# Patient Record
Sex: Male | Born: 2000 | Hispanic: No | Marital: Single | Smoking: Current every day smoker
Health system: Southern US, Community
[De-identification: ages and names within clinical notes are randomized; demographics above are authoritative.]

## PROBLEM LIST (undated history)

## (undated) ENCOUNTER — Emergency Department (HOSPITAL_COMMUNITY): Admission: EM | Payer: Medicaid Other | Source: Home / Self Care

## (undated) DIAGNOSIS — J45909 Unspecified asthma, uncomplicated: Secondary | ICD-10-CM

---
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
## 2022-12-15 ENCOUNTER — Encounter (HOSPITAL_COMMUNITY): Payer: Self-pay | Admitting: Emergency Medicine

## 2022-12-15 ENCOUNTER — Ambulatory Visit (HOSPITAL_COMMUNITY)
Admission: EM | Admit: 2022-12-15 | Discharge: 2022-12-15 | Disposition: A | Discharge status: Home / Self Care | Payer: Medicaid Other | Attending: Internal Medicine | Admitting: Internal Medicine

## 2022-12-15 DIAGNOSIS — H00015 Hordeolum externum left lower eyelid: Secondary | ICD-10-CM | POA: Insufficient documentation

## 2022-12-15 MED ORDER — AMOXICILLIN-POT CLAVULANATE 875-125 MG PO TABS: 1.0000 | ORAL_TABLET | Freq: Two times a day (BID) | ORAL | 0 refills | Status: AC

## 2022-12-15 NOTE — Discharge Instructions (Addendum)
Your eye looks very infected.  Take Augmentin antibiotic twice daily for the next 7 days.  Avoid scratching the eye.  Apply warm compresses with a clean washcloth to the eye.   Do not touch your eye anymore.  This will make it much worse and will stop it from improving and healing appropriately.  If you develop any blurry vision, worsening eye pain, bleeding from the site, or any other new or worsening symptoms, I would like for you to return to urgent care for further evaluation.  I swabbed your eye for gonorrhea today just to make sure this is not the cause. I will call you if the result is positive in the next 2-3 days.

## 2022-12-15 NOTE — ED Triage Notes (Signed)
Pt c/o left eye swelling for over a week. Little painful. Tried OTC eye drops. Denies visual impairment.

## 2022-12-15 NOTE — ED Provider Notes (Signed)
[facility]    CSN: [id number] Arrival date & time: 12/15/22  1635      History   Chief Complaint Chief Complaint  Patient presents with   Facial Swelling    HPI Robert Nicholson is a 22 y.o. male.   Patient presents to urgent care for evaluation of lower left eyelid swelling and soreness for the last 1 week. No recent trauma or injuries to the left eye, face, or head. He does not wear contacts or glasses for vision correction and denies eye redness, eye drainage, fever/chills, vision changes, and headache. No ear pain, sore throat, dizziness, or neck pain reported. Denies recent viral URI symptoms. He is unsure of when the swelling to the left eye became worse and has never had a stye in the past. He states he noticed a "black" area of growth to the outer canthus of the left eye after scratching the eye yesterday. He cannot recall when the black growth to the outer canthus of the eye started or when symptoms worsened as he states he does not look in the mirror frequently. Denies pain to the posterior left eye. Denies history of hypertension and diabetes. No recent antibiotic or steroid use. He is sexually active, however denies penile symptoms and recent known exposure to STD. He has not attempted use of any over the counter medicines.      Past Medical History:  Diagnosis Date   Asthma     There are no problems to display for this patient.   History reviewed. No pertinent surgical history.     Home Medications    Prior to Admission medications   Medication Sig Start Date End Date Taking? Authorizing Provider  amoxicillin-clavulanate (AUGMENTIN) 875-125 MG tablet Take 1 tablet by mouth every 12 (twelve) hours. 12/15/22  Yes Talbot Grumbling, FNP  benzonatate (TESSALON) 100 MG capsule Take 1 capsule (100 mg total) by mouth every 8 (eight) hours. 03/11/20   Griffin Basil, NP  fexofenadine (ALLEGRA) 60 MG tablet Take 1 tablet (60 mg total) by mouth 2  (two) times daily. 03/11/20   Griffin Basil, NP  ibuprofen (ADVIL) 400 MG tablet Take 1 tablet (400 mg total) by mouth every 6 (six) hours as needed. 03/11/20   Haskins, Bebe Shaggy, NP  ondansetron (ZOFRAN ODT) 4 MG disintegrating tablet Take 1 tablet (4 mg total) by mouth every 6 (six) hours as needed for nausea or vomiting. 10/08/18   Kristen Cardinal, NP    Family History No family history on file.  Social History     Allergies   Patient has no known allergies.   Review of Systems Review of Systems Per HPI  Physical Exam Triage Vital Signs ED Triage Vitals  Enc Vitals Group     BP 12/15/22 1809 125/80     Pulse Rate 12/15/22 1809 69     Resp 12/15/22 1809 14     Temp 12/15/22 1809 (!) 97.3 F (36.3 C)     Temp Source 12/15/22 1809 Oral     SpO2 12/15/22 1809 97 %     Weight --      Height --      Head Circumference --      Peak Flow --      Pain Score 12/15/22 1807 2     Pain Loc --      Pain Edu? --      Excl. in [place]? --    No data found.  Updated  Vital Signs BP 125/80 (BP Location: Left Arm)   Pulse 69   Temp (!) 97.3 F (36.3 C) (Oral)   Resp 14   SpO2 97%   Visual Acuity Right Eye Distance:   Left Eye Distance:   Bilateral Distance:    Right Eye Near:   Left Eye Near:    Bilateral Near:     Physical Exam Vitals and nursing note reviewed.  Constitutional:      Appearance: He is not ill-appearing or toxic-appearing.  HENT:     Head: Normocephalic and atraumatic.     Right Ear: Hearing and external ear normal.     Left Ear: Hearing and external ear normal.     Nose: Nose normal.     Mouth/Throat:     Lips: Pink.  Eyes:     General: Lids are normal. Vision grossly intact. Gaze aligned appropriately. No visual field deficit.       Right eye: No foreign body, discharge or hordeolum.        Left eye: Hordeolum present.No foreign body or discharge.     Extraocular Movements: Extraocular movements intact.     Conjunctiva/sclera: Conjunctivae normal.      Comments: EOMs intact without pain or dizziness elicited. Erythema and swelling present to the lower eyelid with crusted black lesion to the outer canthus of the left eye. Upper eyelid is erythematous. Difficult to separate the upper eyelid from the lower eyelid at the outer canthus of the left eye due to crusting. See image below for further detail.   Pulmonary:     Effort: Pulmonary effort is normal.  Musculoskeletal:     Cervical back: Neck supple.  Skin:    General: Skin is warm and dry.     Capillary Refill: Capillary refill takes less than 2 seconds.     Findings: No rash.  Neurological:     General: No focal deficit present.     Mental Status: He is alert and oriented to person, place, and time. Mental status is at baseline.     Cranial Nerves: No dysarthria or facial asymmetry.  Psychiatric:        Mood and Affect: Mood normal.        Speech: Speech normal.        Behavior: Behavior normal.        Thought Content: Thought content normal.        Judgment: Judgment normal.           UC Treatments / Results  Labs (all labs ordered are listed, but only abnormal results are displayed) Labs Reviewed  CYTOLOGY, (ORAL, ANAL, URETHRAL) ANCILLARY ONLY    EKG   Radiology No results found.  Procedures Procedures (including critical care time)  Medications Ordered in UC Medications - No data to display  Initial Impression / Assessment and Plan / UC Course  I have reviewed the triage vital signs and the nursing notes.  Pertinent labs & imaging results that were available during my care of the patient were reviewed by me and considered in my medical decision making (see chart for details).   1. Hordeolum externum of lower eyelid Presentation consistent with hordeolum, though this stye appears very infected. There is concern for possible gonococcal infection, cytology swab of eye drainage from lesion to the outer canthus of the left eye pending. Will treat today with  Augmentin antibiotic to be taken as directed. Patient continuously scratching eye in clinic during interview, therefore patch placed so that he  must leave the eye alone so that it is able to heal. His mother drove him to urgent care and plans on driving him home. Advised patient to refrain from driving while wearing eye patch (gauze and tape) over the left eye due to reduced depth perception. Warm compresses recommended to reduce infection. Strict ER and urgent care return precautions discussed should symptoms worsen. Follow-up with ophthalmology recommended.  Discussed physical exam and available lab work findings in clinic with patient.  Counseled patient regarding appropriate use of medications and potential side effects for all medications recommended or prescribed today. Discussed red flag signs and symptoms of worsening condition,when to call the PCP office, return to urgent care, and when to seek higher level of care in the emergency department. Patient verbalizes understanding and agreement with plan. All questions answered. Patient discharged in stable condition.    Final Clinical Impressions(s) / UC Diagnoses   Final diagnoses:  Hordeolum externum of left lower eyelid     Discharge Instructions      Your eye looks very infected.  Take Augmentin antibiotic twice daily for the next 7 days.  Avoid scratching the eye.  Apply warm compresses with a clean washcloth to the eye.   Do not touch your eye anymore.  This will make it much worse and will stop it from improving and healing appropriately.  If you develop any blurry vision, worsening eye pain, bleeding from the site, or any other new or worsening symptoms, I would like for you to return to urgent care for further evaluation.  I swabbed your eye for gonorrhea today just to make sure this is not the cause. I will call you if the result is positive in the next 2-3 days.      ED Prescriptions     Medication Sig Dispense  Auth. Provider   amoxicillin-clavulanate (AUGMENTIN) 875-125 MG tablet Take 1 tablet by mouth every 12 (twelve) hours. 14 tablet Carlisle Beers, FNP      PDMP not reviewed this encounter.   Carlisle Beers, [place] 12/18/22 2135

## 2022-12-16 LAB — CYTOLOGY, (ORAL, ANAL, URETHRAL) ANCILLARY ONLY
Chlamydia: NEGATIVE
Comment: NEGATIVE
Comment: NORMAL
Neisseria Gonorrhea: NEGATIVE

## 2024-06-22 ENCOUNTER — Emergency Department (HOSPITAL_COMMUNITY)
Admission: EM | Admit: 2024-06-22 | Discharge: 2024-06-22 | Discharge status: Against Medical Advice | Payer: Self-pay | Attending: Emergency Medicine | Admitting: Emergency Medicine

## 2024-06-22 ENCOUNTER — Other Ambulatory Visit: Payer: Self-pay

## 2024-06-22 ENCOUNTER — Encounter (HOSPITAL_COMMUNITY): Payer: Self-pay

## 2024-06-22 ENCOUNTER — Emergency Department (HOSPITAL_COMMUNITY): Payer: Self-pay

## 2024-06-22 DIAGNOSIS — R079 Chest pain, unspecified: Secondary | ICD-10-CM | POA: Insufficient documentation

## 2024-06-22 DIAGNOSIS — Z5321 Procedure and treatment not carried out due to patient leaving prior to being seen by health care provider: Secondary | ICD-10-CM | POA: Insufficient documentation

## 2024-06-22 LAB — CBC
HCT: 43.6 % (ref 39.0–52.0)
Hemoglobin: 14.9 g/dL (ref 13.0–17.0)
MCH: 28.8 pg (ref 26.0–34.0)
MCHC: 34.2 g/dL (ref 30.0–36.0)
MCV: 84.2 fL (ref 80.0–100.0)
Platelets: 259 K/uL (ref 150–400)
RBC: 5.18 MIL/uL (ref 4.22–5.81)
RDW: 13.1 % (ref 11.5–15.5)
WBC: 7.3 K/uL (ref 4.0–10.5)
nRBC: 0 % (ref 0.0–0.2)

## 2024-06-22 LAB — TROPONIN I (HIGH SENSITIVITY): Troponin I (High Sensitivity): 4 ng/L

## 2024-06-22 LAB — BASIC METABOLIC PANEL WITH GFR
Anion gap: 11 (ref 5–15)
BUN: 16 mg/dL (ref 6–20)
CO2: 23 mmol/L (ref 22–32)
Calcium: 9.3 mg/dL (ref 8.9–10.3)
Chloride: 104 mmol/L (ref 98–111)
Creatinine, Ser: 0.99 mg/dL (ref 0.61–1.24)
GFR, Estimated: >60 mL/min
Glucose, Bld: 102 mg/dL — ABNORMAL HIGH (ref 70–99)
Potassium: 3.4 mmol/L — ABNORMAL LOW (ref 3.5–5.1)
Sodium: 138 mmol/L (ref 135–145)

## 2024-06-22 NOTE — ED Notes (Signed)
 Pt stated he was leaving AMA due to work

## 2024-06-22 NOTE — ED Triage Notes (Signed)
 Pt mentions that his on-going chest pain has been an issue for awhile, he has been evaluated for it before but mentions that he wasn't to to do anything with it, he is currently stable, no shortness of breath, and the pain is no reproducible. He is otherwise stable at this time.

## 2024-06-24 ENCOUNTER — Other Ambulatory Visit: Payer: Self-pay

## 2024-06-24 ENCOUNTER — Encounter (HOSPITAL_COMMUNITY): Payer: Self-pay | Admitting: *Deleted

## 2024-06-24 ENCOUNTER — Emergency Department (HOSPITAL_COMMUNITY)
Admission: EM | Admit: 2024-06-24 | Discharge: 2024-06-24 | Disposition: A | Discharge status: Home / Self Care | Payer: Self-pay | Attending: Student | Admitting: Student

## 2024-06-24 DIAGNOSIS — E860 Dehydration: Secondary | ICD-10-CM | POA: Insufficient documentation

## 2024-06-24 DIAGNOSIS — R55 Syncope and collapse: Secondary | ICD-10-CM | POA: Insufficient documentation

## 2024-06-24 DIAGNOSIS — F17210 Nicotine dependence, cigarettes, uncomplicated: Secondary | ICD-10-CM | POA: Insufficient documentation

## 2024-06-24 LAB — CBC WITH DIFFERENTIAL/PLATELET
Abs Immature Granulocytes: 0.04 K/uL (ref 0.00–0.07)
Basophils Absolute: 0 K/uL (ref 0.0–0.1)
Basophils Relative: 0 %
Eosinophils Absolute: 0 K/uL (ref 0.0–0.5)
Eosinophils Relative: 0 %
HCT: 44.6 % (ref 39.0–52.0)
Hemoglobin: 15.1 g/dL (ref 13.0–17.0)
Immature Granulocytes: 0 %
Lymphocytes Relative: 23 %
Lymphs Abs: 2.1 K/uL (ref 0.7–4.0)
MCH: 28.8 pg (ref 26.0–34.0)
MCHC: 33.9 g/dL (ref 30.0–36.0)
MCV: 85.1 fL (ref 80.0–100.0)
Monocytes Absolute: 0.7 K/uL (ref 0.1–1.0)
Monocytes Relative: 7 %
Neutro Abs: 6.4 K/uL (ref 1.7–7.7)
Neutrophils Relative %: 70 %
Platelets: 247 K/uL (ref 150–400)
RBC: 5.24 MIL/uL (ref 4.22–5.81)
RDW: 13.2 % (ref 11.5–15.5)
WBC: 9.3 K/uL (ref 4.0–10.5)
nRBC: 0 % (ref 0.0–0.2)

## 2024-06-24 LAB — COMPREHENSIVE METABOLIC PANEL WITH GFR
ALT: 67 U/L — ABNORMAL HIGH (ref 0–44)
AST: 37 U/L (ref 15–41)
Albumin: 4.3 g/dL (ref 3.5–5.0)
Alkaline Phosphatase: 69 U/L (ref 38–126)
Anion gap: 13 (ref 5–15)
BUN: 10 mg/dL (ref 6–20)
CO2: 21 mmol/L — ABNORMAL LOW (ref 22–32)
Calcium: 9.5 mg/dL (ref 8.9–10.3)
Chloride: 105 mmol/L (ref 98–111)
Creatinine, Ser: 0.98 mg/dL (ref 0.61–1.24)
GFR, Estimated: >60 mL/min (ref >60)
Glucose, Bld: 98 mg/dL (ref 70–99)
Potassium: 3.6 mmol/L (ref 3.5–5.1)
Sodium: 139 mmol/L (ref 135–145)
Total Bilirubin: 0.9 mg/dL (ref 0.0–1.2)
Total Protein: 7.3 g/dL (ref 6.5–8.1)

## 2024-06-24 LAB — TSH: TSH: 2.634 u[IU]/mL (ref 0.350–4.500)

## 2024-06-24 LAB — TROPONIN I (HIGH SENSITIVITY): Troponin I (High Sensitivity): 3 ng/L (ref <18)

## 2024-06-24 MED ORDER — LACTATED RINGERS IV BOLUS
1000.0000 mL | Freq: Once | INTRAVENOUS | Status: AC
  Administered 2024-06-24: 1000 mL via INTRAVENOUS

## 2024-06-24 NOTE — ED Triage Notes (Signed)
 The  pt is c/o feeling numb and has felt like he was passing out for  January 8th  he was also here Saturday am had blood work EKG  he became tired of waiting and left after 4 hours without being seen he had the same symptoms tonight that he has had since [place]

## 2024-06-24 NOTE — ED Provider Notes (Signed)
 [place] EMERGENCY DEPARTMENT AT [facility] Provider Note  CSN: [id number] Arrival date & time: 06/24/24 0155  Chief Complaint(s) Loss of Consciousness  HPI Robert Nicholson is a 23 y.o. male who presents emergency room for evaluation of a syncopal episode.  Patient states that since January he has had intermittent episodes of lightheadedness where he will feel very lightheaded had some numbness of the upper extremities and feeling like he is going to pass out.  Today he did have a syncopal episode at the gas station.  Had a preceding prodrome of lightheadedness.  Endorses some sharp left-sided chest pain that is intermittent.  Nonexertional not associated with diaphoresis, shortness of breath, nausea, vomiting or other systemic symptoms.   Past Medical History No past medical history on file. There are no active problems to display for this patient.  Home Medication(s) Prior to Admission medications   Not on File                                                                                                                                    Past Surgical History No past surgical history on file. Family History No family history on file.  Social History Social History   Tobacco Use   Smoking status: Every Day    Types: Cigarettes    Passive exposure: Current   Smokeless tobacco: Never  Vaping Use   Vaping status: Never Used  Substance Use Topics   Alcohol use: Yes   Drug use: Never   Allergies Patient has no known allergies.  Review of Systems Review of Systems  Cardiovascular:  Positive for chest pain.  Neurological:  Positive for syncope, light-headedness and numbness.    Physical Exam Vital Signs  I have reviewed the triage vital signs BP 135/77   Pulse 69   Temp 98.1 F (36.7 C)   Resp 12   Ht 6' (1.829 m)   Wt 99.8 kg   SpO2 100%   BMI 29.84 kg/m   Physical Exam Constitutional:      General: He is not in acute distress.     Appearance: Normal appearance.  HENT:     Head: Normocephalic and atraumatic.     Nose: No congestion or rhinorrhea.  Eyes:     General:        Right eye: No discharge.        Left eye: No discharge.     Extraocular Movements: Extraocular movements intact.     Pupils: Pupils are equal, round, and reactive to light.  Cardiovascular:     Rate and Rhythm: Normal rate and regular rhythm.     Heart sounds: No murmur heard. Pulmonary:     Effort: No respiratory distress.     Breath sounds: No wheezing or rales.  Abdominal:     General: There is no distension.     Tenderness: There is no abdominal tenderness.  Musculoskeletal:  General: Normal range of motion.     Cervical back: Normal range of motion.  Skin:    General: Skin is warm and dry.  Neurological:     General: No focal deficit present.     Mental Status: He is alert.     ED Results and Treatments Labs (all labs ordered are listed, but only abnormal results are displayed) Labs Reviewed  COMPREHENSIVE METABOLIC PANEL WITH GFR - Abnormal; Notable for the following components:      Result Value   CO2 21 (*)    ALT 67 (*)    All other components within normal limits  CBC WITH DIFFERENTIAL/PLATELET  TSH  RAPID URINE DRUG SCREEN, HOSP PERFORMED  TROPONIN I (HIGH SENSITIVITY)                                                                                                                          Radiology No results found.  Pertinent labs & imaging results that were available during my care of the patient were reviewed by me and considered in my medical decision making (see MDM for details).  Medications Ordered in ED Medications  lactated ringers  bolus 1,000 mL (0 mLs Intravenous Stopped 06/24/24 0553)                                                                                                                                     Procedures Procedures  (including critical care time)  Medical Decision  Making / ED Course   This patient presents to the ED for concern of syncope, this involves an extensive number of treatment options, and is a complaint that carries with it a high risk of complications and morbidity.  The differential diagnosis includes orthostatic syncope, cardiogenic syncope, vasovagal syncope, electrolyte abnormality, dehydration, dysrhythmia, vasovagal, Hypoglycemia, Seizure, Autonomic Insufficiency  MDM: Patient seen emerged part for evaluation of syncope.  Physical exam is unremarkable.  Laboratory valuation also reassuringly unremarkable.  ECG without evidence of dysrhythmia but does show a possible Q wave in the anterior leads.  High-sensitivity troponin is normal however.  Patient fluid resuscitated and on reevaluation states he is feeling much improved.  Suspect patient's syncope likely secondary to dehydration given he works outside as a Corporate investment banker and symptoms improved with fluid resuscitation.  Will place outpatient cardiology referral.  However at this time he does not meet inpatient criteria for admission will  be discharged outpatient follow-up.  Return precautions given which he voiced understanding.   Additional history obtained:  -External records from outside source obtained and reviewed including: Chart review including previous notes, labs, imaging, consultation notes   Lab Tests: -I ordered, reviewed, and interpreted labs.   The pertinent results include:   Labs Reviewed  COMPREHENSIVE METABOLIC PANEL WITH GFR - Abnormal; Notable for the following components:      Result Value   CO2 21 (*)    ALT 67 (*)    All other components within normal limits  CBC WITH DIFFERENTIAL/PLATELET  TSH  RAPID URINE DRUG SCREEN, HOSP PERFORMED  TROPONIN I (HIGH SENSITIVITY)      EKG   EKG Interpretation Date/Time:  Monday June 24 2024 04:10:42 EDT Ventricular Rate:  76 PR Interval:  136 QRS Duration:  101 QT Interval:  378 QTC Calculation: 425 R  Axis:   39  Text Interpretation: Sinus rhythm Abnormal Q suggests inferior infarct Confirmed by Awais Cobarrubias ([id number]) on 06/24/2024 6:02:10 AM         Medicines ordered and prescription drug management: Meds ordered this encounter  Medications   lactated ringers  bolus 1,000 mL    -I have reviewed the patients home medicines and have made adjustments as needed  Critical interventions none   Cardiac Monitoring: The patient was maintained on a cardiac monitor.  I personally viewed and interpreted the cardiac monitored which showed an underlying rhythm of: NSR  Social Determinants of Health:  Factors impacting patients care include: none   Reevaluation: After the interventions noted above, I reevaluated the patient and found that they have :improved  Co morbidities that complicate the patient evaluation No past medical history on file.    Dispostion: I considered admission for this patient, but at this time he does not meet inpatient criteria for admission will be discharged outpatient follow-up.     Final Clinical Impression(s) / ED Diagnoses Final diagnoses:  Syncope, unspecified syncope type  Dehydration     @PCDICTATION @    Albertina Dixon, MD 06/24/24 [phone]

## 2024-06-24 NOTE — ED Notes (Signed)
 Pt placed in room, Pt told this NT he is here for his results from 06/22/24. Pt then stated his chest is currently hurting. EKG obtained. RN and MD made aware.
